# Patient Record
Sex: Male | Born: 1985 | Race: White | Hispanic: No | Marital: Married | Smoking: Never smoker
Health system: Southern US, Community
[De-identification: ages and names within clinical notes are randomized; demographics above are authoritative.]

## PROBLEM LIST (undated history)

## (undated) DIAGNOSIS — G4730 Sleep apnea, unspecified: Secondary | ICD-10-CM

## (undated) HISTORY — PX: TONSILLECTOMY: SUR1361

---

## 2017-09-15 ENCOUNTER — Emergency Department (HOSPITAL_COMMUNITY)
Admission: EM | Admit: 2017-09-15 | Discharge: 2017-09-16 | Disposition: A | Discharge status: Home / Self Care | Payer: BLUE CROSS/BLUE SHIELD | Attending: Emergency Medicine | Admitting: Emergency Medicine

## 2017-09-15 ENCOUNTER — Emergency Department (HOSPITAL_COMMUNITY): Payer: BLUE CROSS/BLUE SHIELD

## 2017-09-15 ENCOUNTER — Other Ambulatory Visit: Payer: Self-pay

## 2017-09-15 ENCOUNTER — Encounter (HOSPITAL_COMMUNITY): Payer: Self-pay

## 2017-09-15 ENCOUNTER — Emergency Department (HOSPITAL_COMMUNITY)
Admission: EM | Admit: 2017-09-15 | Discharge: 2017-09-15 | Disposition: A | Discharge status: Home / Self Care | Payer: BLUE CROSS/BLUE SHIELD

## 2017-09-15 DIAGNOSIS — R51 Headache: Secondary | ICD-10-CM | POA: Insufficient documentation

## 2017-09-15 DIAGNOSIS — B349 Viral infection, unspecified: Secondary | ICD-10-CM

## 2017-09-15 DIAGNOSIS — R509 Fever, unspecified: Secondary | ICD-10-CM | POA: Diagnosis not present

## 2017-09-15 DIAGNOSIS — R519 Headache, unspecified: Secondary | ICD-10-CM

## 2017-09-15 HISTORY — DX: Sleep apnea, unspecified: G47.30

## 2017-09-15 LAB — BASIC METABOLIC PANEL
Anion gap: 11 (ref 5–15)
BUN: 5 mg/dL — ABNORMAL LOW (ref 6–20)
CO2: 24 mmol/L (ref 22–32)
Calcium: 9.2 mg/dL (ref 8.9–10.3)
Chloride: 100 mmol/L — ABNORMAL LOW (ref 101–111)
Creatinine, Ser: 1.16 mg/dL (ref 0.61–1.24)
GFR calc Af Amer: >60 mL/min (ref >60)
GFR calc non Af Amer: >60 mL/min (ref >60)
Glucose, Bld: 114 mg/dL — ABNORMAL HIGH (ref 65–99)
Potassium: 3.5 mmol/L (ref 3.5–5.1)
Sodium: 135 mmol/L (ref 135–145)

## 2017-09-15 LAB — CBC WITH DIFFERENTIAL/PLATELET
Basophils Absolute: 0 10*3/uL (ref 0.0–0.1)
Basophils Relative: 0 %
Eosinophils Absolute: 0 10*3/uL (ref 0.0–0.7)
Eosinophils Relative: 0 %
HCT: 44.8 % (ref 39.0–52.0)
Hemoglobin: 16.1 g/dL (ref 13.0–17.0)
Lymphocytes Relative: 7 %
Lymphs Abs: 1.1 10*3/uL (ref 0.7–4.0)
MCH: 33.7 pg (ref 26.0–34.0)
MCHC: 35.9 g/dL (ref 30.0–36.0)
MCV: 93.7 fL (ref 78.0–100.0)
Monocytes Absolute: 1.4 10*3/uL — ABNORMAL HIGH (ref 0.1–1.0)
Monocytes Relative: 9 %
Neutro Abs: 12.8 10*3/uL — ABNORMAL HIGH (ref 1.7–7.7)
Neutrophils Relative %: 84 %
Platelets: 174 10*3/uL (ref 150–400)
RBC: 4.78 MIL/uL (ref 4.22–5.81)
RDW: 12.8 % (ref 11.5–15.5)
WBC: 15.4 10*3/uL — ABNORMAL HIGH (ref 4.0–10.5)

## 2017-09-15 LAB — I-STAT CG4 LACTIC ACID, ED: Lactic Acid, Venous: 1.25 mmol/L (ref 0.5–1.9)

## 2017-09-15 MED ORDER — LIDOCAINE HCL 2 % IJ SOLN
10.0000 mL | Freq: Once | INTRAMUSCULAR | Status: DC
  Filled 2017-09-15: qty 20

## 2017-09-15 MED ORDER — MORPHINE SULFATE (PF) 4 MG/ML IV SOLN
4.0000 mg | Freq: Once | INTRAVENOUS | Status: AC
  Administered 2017-09-15: 4 mg via INTRAVENOUS
  Filled 2017-09-15: qty 1

## 2017-09-15 NOTE — ED Notes (Signed)
EDP made aware of patients complaints of pain.

## 2017-09-15 NOTE — ED Notes (Signed)
EDP at bedside providing lumbar puncture.

## 2017-09-15 NOTE — ED Notes (Signed)
No answer in waiting room X1,  

## 2017-09-15 NOTE — ED Notes (Signed)
Hourly rounding   Informed of 5 hour wait  Thanked for patience  assured would be seen as soon as possible

## 2017-09-15 NOTE — ED Notes (Signed)
Pt back from CT. No distress observed. 

## 2017-09-15 NOTE — ED Notes (Signed)
ED Provider at bedside. Mina, PA 

## 2017-09-15 NOTE — ED Notes (Signed)
No answer in waiting room 

## 2017-09-15 NOTE — ED Notes (Signed)
Patient transported to CT 

## 2017-09-15 NOTE — ED Triage Notes (Signed)
Pt has had a headache for the past two days, fever today of 103.9, [facility] due to wait time. Some nausea with photosensitivity. Neuro intact
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
## 2017-09-16 ENCOUNTER — Emergency Department (HOSPITAL_COMMUNITY): Payer: BLUE CROSS/BLUE SHIELD

## 2017-09-16 LAB — CSF CELL COUNT WITH DIFFERENTIAL
RBC COUNT CSF: 172 /mm3 — AB
RBC Count, CSF: 0 /mm3
TUBE #: 1
TUBE #: 4
WBC CSF: 3 /mm3 (ref 0–5)
WBC, CSF: 2 /mm3 (ref 0–5)

## 2017-09-16 LAB — HEPATIC FUNCTION PANEL
ALT: 29 U/L (ref 17–63)
AST: 32 U/L (ref 15–41)
Albumin: 4.2 g/dL (ref 3.5–5.0)
Alkaline Phosphatase: 87 U/L (ref 38–126)
BILIRUBIN INDIRECT: 0.9 mg/dL (ref 0.3–0.9)
Bilirubin, Direct: 0.1 mg/dL (ref 0.1–0.5)
Total Bilirubin: 1 mg/dL (ref 0.3–1.2)
Total Protein: 7.5 g/dL (ref 6.5–8.1)

## 2017-09-16 LAB — PROTEIN, CSF: TOTAL PROTEIN, CSF: 45 mg/dL (ref 15–45)

## 2017-09-16 LAB — CRYPTOCOCCAL ANTIGEN, CSF: Crypto Ag: NEGATIVE

## 2017-09-16 LAB — GLUCOSE, CSF: GLUCOSE CSF: 69 mg/dL (ref 40–70)

## 2017-09-16 LAB — INFLUENZA PANEL BY PCR (TYPE A & B)
INFLAPCR: NEGATIVE
Influenza B By PCR: NEGATIVE

## 2017-09-16 MED ORDER — IBUPROFEN 600 MG PO TABS: 600.0000 mg | ORAL_TABLET | Freq: Four times a day (QID) | ORAL | 0 refills | Status: DC | PRN

## 2017-09-16 MED ORDER — SODIUM CHLORIDE 0.9 % IV BOLUS (SEPSIS)
1000.0000 mL | Freq: Once | INTRAVENOUS | Status: AC
  Administered 2017-09-16: 1000 mL via INTRAVENOUS

## 2017-09-16 MED ORDER — PROCHLORPERAZINE EDISYLATE 5 MG/ML IJ SOLN
10.0000 mg | Freq: Once | INTRAMUSCULAR | Status: AC
  Administered 2017-09-16: 10 mg via INTRAVENOUS
  Filled 2017-09-16: qty 2

## 2017-09-16 MED ORDER — KETOROLAC TROMETHAMINE 30 MG/ML IJ SOLN
30.0000 mg | Freq: Once | INTRAMUSCULAR | Status: AC
  Administered 2017-09-16: 30 mg via INTRAVENOUS
  Filled 2017-09-16: qty 1

## 2017-09-16 MED ORDER — ACETAMINOPHEN 500 MG PO TABS: 500.0000 mg | ORAL_TABLET | Freq: Four times a day (QID) | ORAL | 0 refills | Status: DC | PRN

## 2017-09-16 MED ORDER — DEXAMETHASONE SODIUM PHOSPHATE 10 MG/ML IJ SOLN
10.0000 mg | Freq: Once | INTRAMUSCULAR | Status: AC
  Administered 2017-09-16: 10 mg via INTRAVENOUS
  Filled 2017-09-16: qty 1

## 2017-09-16 NOTE — Discharge Instructions (Signed)
Your workup in the emergency department today was very reassuring.  We believe that your fever is due to a viral infection.  It is common for infections to promote headache.  This may be made worse by an elevated temperature/fever.  Continue to get plenty of rest and drink plenty of fluids to prevent dehydration.  We recommend the use of ibuprofen for fever, body aches, headache.  You may supplement this with Tylenol as prescribed, as needed, for persistent symptoms.  You may try Zyrtec or Claritin for congestion.  This may be supplemented with over-the-counter saline nasal spray or Flonase.  Follow-up with your primary care doctor to ensure resolution of symptoms.  YOu may return to the emergency department, as needed, if symptoms persist or worsen.

## 2017-09-16 NOTE — ED Provider Notes (Signed)
6:00 AM Patient care assumed at shift change from [facility], [place]-C.  Patient presenting to the emergency department for fever and headache.  He has also had nasal congestion and cough secondary to postnasal drip.  He was noted to be febrile and tachycardic on arrival with a leukocytosis.  No hypotension and lactate WNL.  No meningismus, though there was concern for meningitis.  He has a history of travel to [place] with return 9 days ago.  Patient with reassuring workup today.  His CSF studies do not suggest infection. Question whether tubes 1 and 4 were reversed in lab as it would be more likely for hematuria to clear after a traumatic tap.  Still, no organisms seen on culture.  Only 2-3 white blood cells per collection tube.  Cryptococcal antigen also negative.  No changes to CSF protein or glucose.  Patient reports improving headache with migraine cocktail.  He rating it at 3/10.  Wife expressed concern for influenza.  A flu swab was run as well as a chest x-ray, both of which are negative.  It is possible that fever is secondary to a viral illness.  Given [place]. Plan for continued supportive management with outpatient PCP follow up. Return precautions discussed and provided. Patient discharged in stable condition with no unaddressed concerns.   Results for orders placed or performed during the hospital encounter of 09/15/17  CSF culture  Result Value Ref Range   Specimen Description CSF    Special Requests NONE    Gram Stain      CYTOSPIN SMEAR WBC PRESENT, PREDOMINANTLY MONONUCLEAR NO ORGANISMS SEEN    Culture PENDING    Report Status PENDING   Basic metabolic panel  Result Value Ref Range   Sodium 135 135 - 145 mmol/L   Potassium 3.5 3.5 - 5.1 mmol/L   Chloride 100 (L) 101 - 111 mmol/L   CO2 24 22 - 32 mmol/L   Glucose, Bld 114 (H) 65 - 99 mg/dL   BUN 5 (L) 6 - 20 mg/dL   Creatinine, Ser 1.611.16 0.61 - 1.24 mg/dL   Calcium 9.2 8.9 -  09.610.3 mg/dL   GFR calc non Af Amer >60 >60 mL/min   GFR calc Af Amer >60 >60 mL/min   Anion gap 11 5 - 15  CBC with Differential  Result Value Ref Range   WBC 15.4 (H) 4.0 - 10.5 K/uL   RBC 4.78 4.22 - 5.81 MIL/uL   Hemoglobin 16.1 13.0 - 17.0 g/dL   HCT 04.544.8 40.939.0 - 81.152.0 %   MCV 93.7 78.0 - 100.0 fL   MCH 33.7 26.0 - 34.0 pg   MCHC 35.9 30.0 - 36.0 g/dL   RDW 91.412.8 78.211.5 - 95.615.5 %   Platelets 174 150 - 400 K/uL   Neutrophils Relative % 84 %   Neutro Abs 12.8 (H) 1.7 - 7.7 K/uL   Lymphocytes Relative 7 %   Lymphs Abs 1.1 0.7 - 4.0 K/uL   Monocytes Relative 9 %   Monocytes Absolute 1.4 (H) 0.1 - 1.0 K/uL   Eosinophils Relative 0 %   Eosinophils Absolute 0.0 0.0 - 0.7 K/uL   Basophils Relative 0 %   Basophils Absolute 0.0 0.0 - 0.1 K/uL  CSF cell count with differential collection tube #: 1  Result Value Ref Range   Tube # 1    Color, CSF COLORLESS COLORLESS   Appearance, CSF CLEAR CLEAR   Supernatant NOT INDICATED  RBC Count, CSF 0 0 /cu mm   WBC, CSF 2 0 - 5 /cu mm   Other Cells, CSF TOO FEW TO COUNT, SMEAR AVAILABLE FOR REVIEW   CSF cell count with differential collection tube #: 4  Result Value Ref Range   Tube # 4    Color, CSF COLORLESS COLORLESS   Appearance, CSF CLEAR CLEAR   Supernatant NOT INDICATED    RBC Count, CSF 172 (H) 0 /cu mm   WBC, CSF 3 0 - 5 /cu mm   Other Cells, CSF TOO FEW TO COUNT, SMEAR AVAILABLE FOR REVIEW   Glucose, CSF  Result Value Ref Range   Glucose, CSF 69 40 - 70 mg/dL  Protein, CSF  Result Value Ref Range   Total  Protein, CSF 45 15 - 45 mg/dL  Cryptococcal antigen, CSF  Result Value Ref Range   Crypto Ag NEGATIVE NEGATIVE   Cryptococcal Ag Titer NOT INDICATED NOT INDICATED  Influenza panel by PCR (type A & B)  Result Value Ref Range   Influenza A By PCR NEGATIVE NEGATIVE   Influenza B By PCR NEGATIVE NEGATIVE  Hepatic function panel  Result Value Ref Range   Total Protein 7.5 6.5 - 8.1 g/dL   Albumin 4.2 3.5 - 5.0 g/dL   AST  32 15 - 41 U/L   ALT 29 17 - 63 U/L   Alkaline Phosphatase 87 38 - 126 U/L   Total Bilirubin 1.0 0.3 - 1.2 mg/dL   Bilirubin, Direct 0.1 0.1 - 0.5 mg/dL   Indirect Bilirubin 0.9 0.3 - 0.9 mg/dL  I-Stat CG4 Lactic Acid, ED  Result Value Ref Range   Lactic Acid, Venous 1.25 0.5 - 1.9 mmol/L   Dg Chest 2 View  Result Date: 09/16/2017 CLINICAL DATA:  31 year old male with fever. EXAM: CHEST  2 VIEW COMPARISON:  None. FINDINGS: The heart size and mediastinal contours are within normal limits. Both lungs are clear. The visualized skeletal structures are unremarkable. IMPRESSION: No active cardiopulmonary disease. Electronically Signed   By: Elgie CollardArash  Radparvar M.D.   On: 09/16/2017 03:23   Ct Head Wo Contrast  Result Date: 09/15/2017 CLINICAL DATA:  31 year old male with headache. Concern for meningitis or encephalitis. EXAM: CT HEAD WITHOUT CONTRAST TECHNIQUE: Contiguous axial images were obtained from the base of the skull through the vertex without intravenous contrast. COMPARISON:  None. FINDINGS: Brain: No evidence of acute infarction, hemorrhage, hydrocephalus, extra-axial collection or mass lesion/mass effect. Vascular: No hyperdense vessel or unexpected calcification. Skull: Normal. Negative for fracture or focal lesion. Sinuses/Orbits: Minimal mucoperiosteal thickening of paranasal sinuses. No air-fluid levels. The mastoid air cells are clear. Other: None IMPRESSION: Unremarkable unenhanced CT of the brain. Please note noncontrast CT has limited diagnostic value for evaluation of meningitis or encephalitis. If further imaging workup is clinically indicated, MRI or CT angiography may provide better evaluation. Electronically Signed   By: Elgie CollardArash  Radparvar M.D.   On: 09/15/2017 23:23      Antony MaduraHumes, Jastin Fore, PA-C 09/16/17 [id number]    Geoffery Lyonselo, [place], MD 09/16/17 [phone]

## 2017-09-17 LAB — HERPES SIMPLEX VIRUS(HSV) DNA BY PCR
HSV 1 DNA: NEGATIVE
HSV 2 DNA: NEGATIVE

## 2017-09-19 LAB — CSF CULTURE W GRAM STAIN: Culture: NO GROWTH

## 2017-09-19 LAB — CSF CULTURE

## 2017-09-20 LAB — CULTURE, BLOOD (ROUTINE X 2)
Culture: NO GROWTH
Culture: NO GROWTH
Special Requests: ADEQUATE
Special Requests: ADEQUATE

## 2017-11-19 LAB — ARBOVIRUS IGG, CSF: West Nile IgG CSF: 0.35 IV (ref <1.30)

## 2017-11-19 LAB — VDRL, CSF: VDRL Quant, CSF: NONREACTIVE

## 2017-11-19 LAB — HSV CULTURE AND TYPING

## 2018-04-05 ENCOUNTER — Emergency Department (HOSPITAL_COMMUNITY): Payer: No Typology Code available for payment source

## 2018-04-05 ENCOUNTER — Inpatient Hospital Stay (HOSPITAL_COMMUNITY)
Admission: EM | Admit: 2018-04-05 | Discharge: 2018-04-12 | DRG: 516 | Disposition: A | Discharge status: Home / Self Care | Payer: No Typology Code available for payment source | Attending: Student | Admitting: Student

## 2018-04-05 ENCOUNTER — Encounter (HOSPITAL_COMMUNITY): Payer: Self-pay | Admitting: Oncology

## 2018-04-05 ENCOUNTER — Other Ambulatory Visit: Payer: Self-pay

## 2018-04-05 DIAGNOSIS — Z9089 Acquired absence of other organs: Secondary | ICD-10-CM

## 2018-04-05 DIAGNOSIS — Z653 Problems related to other legal circumstances: Secondary | ICD-10-CM

## 2018-04-05 DIAGNOSIS — F10929 Alcohol use, unspecified with intoxication, unspecified: Secondary | ICD-10-CM | POA: Diagnosis present

## 2018-04-05 DIAGNOSIS — M25551 Pain in right hip: Secondary | ICD-10-CM | POA: Diagnosis not present

## 2018-04-05 DIAGNOSIS — D62 Acute posthemorrhagic anemia: Secondary | ICD-10-CM | POA: Diagnosis not present

## 2018-04-05 DIAGNOSIS — S32421A Displaced fracture of posterior wall of right acetabulum, initial encounter for closed fracture: Secondary | ICD-10-CM | POA: Diagnosis not present

## 2018-04-05 DIAGNOSIS — Z23 Encounter for immunization: Secondary | ICD-10-CM

## 2018-04-05 DIAGNOSIS — F17220 Nicotine dependence, chewing tobacco, uncomplicated: Secondary | ICD-10-CM | POA: Diagnosis present

## 2018-04-05 DIAGNOSIS — F10129 Alcohol abuse with intoxication, unspecified: Secondary | ICD-10-CM | POA: Diagnosis present

## 2018-04-05 DIAGNOSIS — S01111A Laceration without foreign body of right eyelid and periocular area, initial encounter: Secondary | ICD-10-CM | POA: Diagnosis present

## 2018-04-05 DIAGNOSIS — R402362 Coma scale, best motor response, obeys commands, at arrival to emergency department: Secondary | ICD-10-CM | POA: Diagnosis present

## 2018-04-05 DIAGNOSIS — Z419 Encounter for procedure for purposes other than remedying health state, unspecified: Secondary | ICD-10-CM

## 2018-04-05 DIAGNOSIS — R402242 Coma scale, best verbal response, confused conversation, at arrival to emergency department: Secondary | ICD-10-CM | POA: Diagnosis present

## 2018-04-05 DIAGNOSIS — S32409A Unspecified fracture of unspecified acetabulum, initial encounter for closed fracture: Secondary | ICD-10-CM

## 2018-04-05 DIAGNOSIS — S73004A Unspecified dislocation of right hip, initial encounter: Secondary | ICD-10-CM

## 2018-04-05 DIAGNOSIS — R402142 Coma scale, eyes open, spontaneous, at arrival to emergency department: Secondary | ICD-10-CM | POA: Diagnosis present

## 2018-04-05 DIAGNOSIS — Z79899 Other long term (current) drug therapy: Secondary | ICD-10-CM

## 2018-04-05 DIAGNOSIS — S73014A Posterior dislocation of right hip, initial encounter: Secondary | ICD-10-CM | POA: Diagnosis present

## 2018-04-05 DIAGNOSIS — S0181XA Laceration without foreign body of other part of head, initial encounter: Secondary | ICD-10-CM

## 2018-04-05 DIAGNOSIS — S0081XA Abrasion of other part of head, initial encounter: Secondary | ICD-10-CM

## 2018-04-05 DIAGNOSIS — Y92410 Unspecified street and highway as the place of occurrence of the external cause: Secondary | ICD-10-CM

## 2018-04-05 DIAGNOSIS — Z880 Allergy status to penicillin: Secondary | ICD-10-CM

## 2018-04-05 DIAGNOSIS — S32401A Unspecified fracture of right acetabulum, initial encounter for closed fracture: Secondary | ICD-10-CM | POA: Diagnosis present

## 2018-04-05 LAB — CBC
HEMATOCRIT: 42.3 % (ref 39.0–52.0)
Hemoglobin: 14.5 g/dL (ref 13.0–17.0)
MCH: 31.9 pg (ref 26.0–34.0)
MCHC: 34.3 g/dL (ref 30.0–36.0)
MCV: 93 fL (ref 78.0–100.0)
Platelets: 225 10*3/uL (ref 150–400)
RBC: 4.55 MIL/uL (ref 4.22–5.81)
RDW: 12.2 % (ref 11.5–15.5)
WBC: 10.8 10*3/uL — AB (ref 4.0–10.5)

## 2018-04-05 LAB — COMPREHENSIVE METABOLIC PANEL
ALT: 35 U/L (ref 17–63)
AST: 61 U/L — AB (ref 15–41)
Albumin: 4.3 g/dL (ref 3.5–5.0)
Alkaline Phosphatase: 92 U/L (ref 38–126)
Anion gap: 9 (ref 5–15)
BUN: 11 mg/dL (ref 6–20)
CO2: 26 mmol/L (ref 22–32)
Calcium: 8.6 mg/dL — ABNORMAL LOW (ref 8.9–10.3)
Chloride: 109 mmol/L (ref 101–111)
Creatinine, Ser: 1.11 mg/dL (ref 0.61–1.24)
GFR calc Af Amer: >60 mL/min (ref >60)
Glucose, Bld: 116 mg/dL — ABNORMAL HIGH (ref 65–99)
POTASSIUM: 3.3 mmol/L — AB (ref 3.5–5.1)
Sodium: 144 mmol/L (ref 135–145)
Total Bilirubin: 0.7 mg/dL (ref 0.3–1.2)
Total Protein: 6.7 g/dL (ref 6.5–8.1)

## 2018-04-05 LAB — I-STAT CHEM 8, ED
BUN: 13 mg/dL (ref 6–20)
CREATININE: 1.3 mg/dL — AB (ref 0.61–1.24)
Calcium, Ion: 1.1 mmol/L — ABNORMAL LOW (ref 1.15–1.40)
Chloride: 106 mmol/L (ref 101–111)
Glucose, Bld: 114 mg/dL — ABNORMAL HIGH (ref 65–99)
HEMATOCRIT: 42 % (ref 39.0–52.0)
HEMOGLOBIN: 14.3 g/dL (ref 13.0–17.0)
Potassium: 3.2 mmol/L — ABNORMAL LOW (ref 3.5–5.1)
SODIUM: 147 mmol/L — AB (ref 135–145)
TCO2: 25 mmol/L (ref 22–32)

## 2018-04-05 LAB — I-STAT CG4 LACTIC ACID, ED: Lactic Acid, Venous: 2.1 mmol/L (ref 0.5–1.9)

## 2018-04-05 LAB — SAMPLE TO BLOOD BANK

## 2018-04-05 LAB — ETHANOL: Alcohol, Ethyl (B): 222 mg/dL — ABNORMAL HIGH (ref <10)

## 2018-04-05 LAB — PROTIME-INR
INR: 0.99
PROTHROMBIN TIME: 13 s (ref 11.4–15.2)

## 2018-04-05 MED ORDER — IOHEXOL 300 MG/ML  SOLN
100.0000 mL | Freq: Once | INTRAMUSCULAR | Status: AC | PRN
  Administered 2018-04-05: 100 mL via INTRAVENOUS

## 2018-04-05 MED ORDER — FENTANYL CITRATE (PF) 100 MCG/2ML IJ SOLN
100.0000 ug | Freq: Once | INTRAMUSCULAR | Status: AC
  Administered 2018-04-05: 100 ug via INTRAVENOUS

## 2018-04-05 MED ORDER — FENTANYL CITRATE (PF) 100 MCG/2ML IJ SOLN
INTRAMUSCULAR | Status: AC
  Administered 2018-04-05: 100 ug via INTRAVENOUS
  Filled 2018-04-05: qty 2

## 2018-04-05 NOTE — ED Notes (Signed)
Patient transported to CT 

## 2018-04-05 NOTE — ED Provider Notes (Signed)
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 orthopedic service.  CRITICAL CARE Performed by: Gilda CreasePOLLINA, Roman Dubuc J.   Total critical care time: 30 minutes  Critical care time was exclusive of separately billable procedures and treating other patients.  Critical care was necessary to treat or prevent imminent or life-threatening deterioration.  Critical care was time spent personally by me on the following activities: development of treatment plan with patient and/or surrogate as well as nursing, discussions with consultants, evaluation of patient's response to treatment, examination of patient, obtaining history from patient or surrogate, ordering and performing treatments and interventions, ordering and review of laboratory studies, ordering and review of radiographic studies, pulse oximetry and re-evaluation of patient's condition.   Final Clinical Impressions(s) / ED Diagnoses   Final diagnoses:  Facial laceration, initial encounter  Abrasion of face, initial  encounter  Closed displaced fracture of right acetabulum, unspecified portion of acetabulum, initial encounter [facility](HCC)  Hip dislocation, right, initial encounter [facility](HCC)    ED Discharge Orders    None       Donja Tipping, Canary Brimhristopher J, MD 04/06/18 0725
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
## 2018-04-06 ENCOUNTER — Inpatient Hospital Stay (HOSPITAL_COMMUNITY): Payer: No Typology Code available for payment source

## 2018-04-06 ENCOUNTER — Emergency Department (HOSPITAL_COMMUNITY): Payer: No Typology Code available for payment source

## 2018-04-06 DIAGNOSIS — R402242 Coma scale, best verbal response, confused conversation, at arrival to emergency department: Secondary | ICD-10-CM | POA: Diagnosis present

## 2018-04-06 DIAGNOSIS — S32401A Unspecified fracture of right acetabulum, initial encounter for closed fracture: Secondary | ICD-10-CM | POA: Diagnosis present

## 2018-04-06 DIAGNOSIS — R402362 Coma scale, best motor response, obeys commands, at arrival to emergency department: Secondary | ICD-10-CM | POA: Diagnosis present

## 2018-04-06 DIAGNOSIS — D62 Acute posthemorrhagic anemia: Secondary | ICD-10-CM | POA: Diagnosis not present

## 2018-04-06 DIAGNOSIS — Z79899 Other long term (current) drug therapy: Secondary | ICD-10-CM | POA: Diagnosis not present

## 2018-04-06 DIAGNOSIS — S32421A Displaced fracture of posterior wall of right acetabulum, initial encounter for closed fracture: Secondary | ICD-10-CM | POA: Diagnosis present

## 2018-04-06 DIAGNOSIS — M25551 Pain in right hip: Secondary | ICD-10-CM | POA: Diagnosis present

## 2018-04-06 DIAGNOSIS — Z653 Problems related to other legal circumstances: Secondary | ICD-10-CM | POA: Diagnosis not present

## 2018-04-06 DIAGNOSIS — F17220 Nicotine dependence, chewing tobacco, uncomplicated: Secondary | ICD-10-CM | POA: Diagnosis present

## 2018-04-06 DIAGNOSIS — R402142 Coma scale, eyes open, spontaneous, at arrival to emergency department: Secondary | ICD-10-CM | POA: Diagnosis present

## 2018-04-06 DIAGNOSIS — S73014A Posterior dislocation of right hip, initial encounter: Secondary | ICD-10-CM | POA: Diagnosis present

## 2018-04-06 DIAGNOSIS — Z880 Allergy status to penicillin: Secondary | ICD-10-CM | POA: Diagnosis not present

## 2018-04-06 DIAGNOSIS — F10129 Alcohol abuse with intoxication, unspecified: Secondary | ICD-10-CM | POA: Diagnosis present

## 2018-04-06 DIAGNOSIS — Z9089 Acquired absence of other organs: Secondary | ICD-10-CM | POA: Diagnosis not present

## 2018-04-06 DIAGNOSIS — Y92410 Unspecified street and highway as the place of occurrence of the external cause: Secondary | ICD-10-CM | POA: Diagnosis not present

## 2018-04-06 DIAGNOSIS — S01111A Laceration without foreign body of right eyelid and periocular area, initial encounter: Secondary | ICD-10-CM | POA: Diagnosis present

## 2018-04-06 DIAGNOSIS — Z23 Encounter for immunization: Secondary | ICD-10-CM | POA: Diagnosis not present

## 2018-04-06 LAB — URINALYSIS, ROUTINE W REFLEX MICROSCOPIC
BILIRUBIN URINE: NEGATIVE
Bacteria, UA: NONE SEEN
Glucose, UA: NEGATIVE mg/dL
Ketones, ur: NEGATIVE mg/dL
Leukocytes, UA: NEGATIVE
Nitrite: NEGATIVE
PROTEIN: NEGATIVE mg/dL
SPECIFIC GRAVITY, URINE: 1.027 (ref 1.005–1.030)
pH: 6 (ref 5.0–8.0)

## 2018-04-06 LAB — CBC WITH DIFFERENTIAL/PLATELET
Abs Immature Granulocytes: 0.1 10*3/uL (ref 0.0–0.1)
Basophils Absolute: 0 10*3/uL (ref 0.0–0.1)
Basophils Relative: 0 %
EOS ABS: 0.1 10*3/uL (ref 0.0–0.7)
EOS PCT: 0 %
HEMATOCRIT: 41.4 % (ref 39.0–52.0)
Hemoglobin: 14.1 g/dL (ref 13.0–17.0)
Immature Granulocytes: 0 %
Lymphocytes Relative: 13 %
Lymphs Abs: 1.6 10*3/uL (ref 0.7–4.0)
MCH: 31.8 pg (ref 26.0–34.0)
MCHC: 34.1 g/dL (ref 30.0–36.0)
MCV: 93.2 fL (ref 78.0–100.0)
MONO ABS: 1.3 10*3/uL — AB (ref 0.1–1.0)
MONOS PCT: 11 %
NEUTROS PCT: 76 %
Neutro Abs: 9.2 10*3/uL — ABNORMAL HIGH (ref 1.7–7.7)
PLATELETS: 183 10*3/uL (ref 150–400)
RBC: 4.44 MIL/uL (ref 4.22–5.81)
RDW: 12.3 % (ref 11.5–15.5)
WBC: 12.3 10*3/uL — ABNORMAL HIGH (ref 4.0–10.5)

## 2018-04-06 LAB — CDS SEROLOGY

## 2018-04-06 LAB — LACTIC ACID, PLASMA: Lactic Acid, Venous: 1.1 mmol/L (ref 0.5–1.9)

## 2018-04-06 MED ORDER — SODIUM CHLORIDE 0.9% FLUSH: 3.0000 mL | INTRAVENOUS | Status: DC | PRN

## 2018-04-06 MED ORDER — HYDROMORPHONE HCL 2 MG/ML IJ SOLN
0.5000 mg | Freq: Once | INTRAMUSCULAR | Status: AC
  Administered 2018-04-06: 0.5 mg via INTRAVENOUS
  Filled 2018-04-06: qty 1

## 2018-04-06 MED ORDER — LORAZEPAM 1 MG PO TABS
1.0000 mg | ORAL_TABLET | Freq: Four times a day (QID) | ORAL | Status: AC | PRN
  Administered 2018-04-08: 1 mg via ORAL
  Filled 2018-04-06: qty 1

## 2018-04-06 MED ORDER — DOCUSATE SODIUM 100 MG PO CAPS
100.0000 mg | ORAL_CAPSULE | Freq: Two times a day (BID) | ORAL | Status: DC
  Administered 2018-04-06 – 2018-04-12 (×12): 100 mg via ORAL
  Filled 2018-04-06 (×12): qty 1

## 2018-04-06 MED ORDER — PROPOFOL 10 MG/ML IV BOLUS
0.5000 mg/kg | Freq: Once | INTRAVENOUS | Status: AC
Start: 2018-04-06 — End: 2018-04-06
  Administered 2018-04-06: 43.6 mg via INTRAVENOUS
  Filled 2018-04-06: qty 20

## 2018-04-06 MED ORDER — SODIUM CHLORIDE 0.9 % IV SOLN: 250.0000 mL | INTRAVENOUS | Status: DC | PRN

## 2018-04-06 MED ORDER — FOLIC ACID 1 MG PO TABS
1.0000 mg | ORAL_TABLET | Freq: Every day | ORAL | Status: DC
  Administered 2018-04-07 – 2018-04-12 (×6): 1 mg via ORAL
  Filled 2018-04-06 (×6): qty 1

## 2018-04-06 MED ORDER — SENNOSIDES-DOCUSATE SODIUM 8.6-50 MG PO TABS
1.0000 | ORAL_TABLET | Freq: Every evening | ORAL | Status: DC | PRN
  Administered 2018-04-10: 1 via ORAL
  Filled 2018-04-06: qty 1

## 2018-04-06 MED ORDER — SODIUM CHLORIDE 0.9% FLUSH
3.0000 mL | Freq: Two times a day (BID) | INTRAVENOUS | Status: DC
Start: 2018-04-06 — End: 2018-04-12
  Administered 2018-04-06 – 2018-04-12 (×10): 3 mL via INTRAVENOUS

## 2018-04-06 MED ORDER — HYDROMORPHONE HCL 2 MG/ML IJ SOLN
1.0000 mg | INTRAMUSCULAR | Status: DC | PRN
  Administered 2018-04-06 – 2018-04-07 (×7): 1 mg via INTRAVENOUS
  Filled 2018-04-06 (×8): qty 1

## 2018-04-06 MED ORDER — OXYCODONE HCL 5 MG PO TABS
5.0000 mg | ORAL_TABLET | ORAL | Status: DC | PRN
  Administered 2018-04-06 – 2018-04-07 (×3): 5 mg via ORAL
  Filled 2018-04-06 (×5): qty 1

## 2018-04-06 MED ORDER — VITAMIN B-1 100 MG PO TABS
100.0000 mg | ORAL_TABLET | Freq: Every day | ORAL | Status: DC
  Administered 2018-04-07 – 2018-04-12 (×6): 100 mg via ORAL
  Filled 2018-04-06 (×6): qty 1

## 2018-04-06 MED ORDER — ASPIRIN EC 325 MG PO TBEC: 325.0000 mg | DELAYED_RELEASE_TABLET | Freq: Every day | ORAL | Status: DC

## 2018-04-06 MED ORDER — MAGNESIUM CITRATE PO SOLN
1.0000 | Freq: Once | ORAL | Status: AC | PRN
  Administered 2018-04-11: 1 via ORAL
  Filled 2018-04-06: qty 296

## 2018-04-06 MED ORDER — PROPOFOL 10 MG/ML IV BOLUS
INTRAVENOUS | Status: AC | PRN
  Administered 2018-04-06: 20 mg via INTRAVENOUS
  Administered 2018-04-06 (×3): 40 mg via INTRAVENOUS

## 2018-04-06 MED ORDER — LORAZEPAM 2 MG/ML IJ SOLN: 0.0000 mg | Freq: Four times a day (QID) | INTRAMUSCULAR | Status: AC

## 2018-04-06 MED ORDER — LORAZEPAM 2 MG/ML IJ SOLN: 1.0000 mg | Freq: Four times a day (QID) | INTRAMUSCULAR | Status: AC | PRN

## 2018-04-06 MED ORDER — LORAZEPAM 2 MG/ML IJ SOLN
0.0000 mg | Freq: Two times a day (BID) | INTRAMUSCULAR | Status: AC
  Administered 2018-04-10: 4 mg via INTRAVENOUS
  Filled 2018-04-06: qty 2

## 2018-04-06 MED ORDER — BISACODYL 10 MG RE SUPP
10.0000 mg | Freq: Every day | RECTAL | Status: DC | PRN
  Administered 2018-04-11: 10 mg via RECTAL
  Filled 2018-04-06: qty 1

## 2018-04-06 MED ORDER — ADULT MULTIVITAMIN W/MINERALS CH
1.0000 | ORAL_TABLET | Freq: Every day | ORAL | Status: DC
  Administered 2018-04-07 – 2018-04-12 (×6): 1 via ORAL
  Filled 2018-04-06 (×6): qty 1

## 2018-04-06 MED ORDER — POTASSIUM CHLORIDE IN NACL 20-0.9 MEQ/L-% IV SOLN
INTRAVENOUS | Status: DC
  Administered 2018-04-07 – 2018-04-08 (×3): via INTRAVENOUS
  Filled 2018-04-06 (×3): qty 1000

## 2018-04-06 MED ORDER — THIAMINE HCL 100 MG/ML IJ SOLN: 100.0000 mg | Freq: Every day | INTRAMUSCULAR | Status: DC
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
## 2018-04-06 NOTE — ED Notes (Signed)
Attempted report x1. 

## 2018-04-06 NOTE — Progress Notes (Signed)
Orthopedic Tech Progress Note Patient Details:  Spencer Gilbert 02/18/1986 [id number]  Ortho Devices Type of Ortho Device: Knee Immobilizer Ortho Device/Splint Location: rle. applyed post reduction. Ortho Device/Splint Interventions: Ordered, Application, Adjustment   Post Interventions Patient Tolerated: Well Instructions Provided: Care of device, Adjustment of device   [name], [name] 04/06/2018, 1:17 AM

## 2018-04-07 ENCOUNTER — Inpatient Hospital Stay (HOSPITAL_COMMUNITY): Payer: No Typology Code available for payment source | Admitting: Certified Registered Nurse Anesthetist

## 2018-04-07 ENCOUNTER — Inpatient Hospital Stay (HOSPITAL_COMMUNITY): Payer: No Typology Code available for payment source

## 2018-04-07 ENCOUNTER — Encounter (HOSPITAL_COMMUNITY)
Admission: EM | Disposition: A | Discharge status: Home / Self Care | Payer: Self-pay | Source: Home / Self Care | Attending: Student

## 2018-04-07 HISTORY — PX: ORIF ACETABULAR FRACTURE: SHX5029

## 2018-04-07 LAB — CBC WITH DIFFERENTIAL/PLATELET
Abs Immature Granulocytes: 0 10*3/uL (ref 0.0–0.1)
BASOS ABS: 0.1 10*3/uL (ref 0.0–0.1)
BASOS PCT: 1 %
Eosinophils Absolute: 0.2 10*3/uL (ref 0.0–0.7)
Eosinophils Relative: 2 %
HCT: 43.7 % (ref 39.0–52.0)
Hemoglobin: 14.8 g/dL (ref 13.0–17.0)
IMMATURE GRANULOCYTES: 0 %
Lymphocytes Relative: 16 %
Lymphs Abs: 1.8 10*3/uL (ref 0.7–4.0)
MCH: 31.6 pg (ref 26.0–34.0)
MCHC: 33.9 g/dL (ref 30.0–36.0)
MCV: 93.2 fL (ref 78.0–100.0)
MONO ABS: 1.5 10*3/uL — AB (ref 0.1–1.0)
Monocytes Relative: 13 %
NEUTROS ABS: 8 10*3/uL — AB (ref 1.7–7.7)
NEUTROS PCT: 68 %
PLATELETS: 179 10*3/uL (ref 150–400)
RBC: 4.69 MIL/uL (ref 4.22–5.81)
RDW: 12.1 % (ref 11.5–15.5)
WBC: 11.7 10*3/uL — AB (ref 4.0–10.5)

## 2018-04-07 LAB — ABO/RH: ABO/RH(D): O POS

## 2018-04-07 LAB — COMPREHENSIVE METABOLIC PANEL
ALBUMIN: 3.7 g/dL (ref 3.5–5.0)
ALT: 43 U/L (ref 17–63)
AST: 111 U/L — ABNORMAL HIGH (ref 15–41)
Alkaline Phosphatase: 86 U/L (ref 38–126)
Anion gap: 5 (ref 5–15)
BUN: 11 mg/dL (ref 6–20)
CHLORIDE: 104 mmol/L (ref 101–111)
CO2: 27 mmol/L (ref 22–32)
CREATININE: 0.83 mg/dL (ref 0.61–1.24)
Calcium: 8.7 mg/dL — ABNORMAL LOW (ref 8.9–10.3)
GFR calc non Af Amer: >60 mL/min (ref >60)
Glucose, Bld: 108 mg/dL — ABNORMAL HIGH (ref 65–99)
Potassium: 3.7 mmol/L (ref 3.5–5.1)
SODIUM: 136 mmol/L (ref 135–145)
Total Bilirubin: 0.9 mg/dL (ref 0.3–1.2)
Total Protein: 6.3 g/dL — ABNORMAL LOW (ref 6.5–8.1)

## 2018-04-07 LAB — TYPE AND SCREEN
ABO/RH(D): O POS
ANTIBODY SCREEN: NEGATIVE

## 2018-04-07 LAB — RAPID URINE DRUG SCREEN, HOSP PERFORMED
Amphetamines: NOT DETECTED
BARBITURATES: NOT DETECTED
Benzodiazepines: NOT DETECTED
Cocaine: NOT DETECTED
Opiates: POSITIVE — AB
TETRAHYDROCANNABINOL: NOT DETECTED

## 2018-04-07 LAB — POCT I-STAT 4, (NA,K, GLUC, HGB,HCT)
Glucose, Bld: 104 mg/dL — ABNORMAL HIGH (ref 65–99)
HEMATOCRIT: 37 % — AB (ref 39.0–52.0)
HEMOGLOBIN: 12.6 g/dL — AB (ref 13.0–17.0)
Potassium: 4.1 mmol/L (ref 3.5–5.1)
Sodium: 139 mmol/L (ref 135–145)

## 2018-04-07 LAB — PROTIME-INR
INR: 1.06
Prothrombin Time: 13.7 seconds (ref 11.4–15.2)

## 2018-04-07 LAB — MRSA PCR SCREENING: MRSA by PCR: NEGATIVE

## 2018-04-07 LAB — LACTIC ACID, PLASMA: Lactic Acid, Venous: 0.8 mmol/L (ref 0.5–1.9)

## 2018-04-07 LAB — APTT: aPTT: 30 seconds (ref 24–36)

## 2018-04-07 LAB — HIV ANTIBODY (ROUTINE TESTING W REFLEX): HIV Screen 4th Generation wRfx: NONREACTIVE

## 2018-04-07 SURGERY — OPEN REDUCTION INTERNAL FIXATION (ORIF) ACETABULAR FRACTURE
Anesthesia: General | Site: Hip | Laterality: Right

## 2018-04-07 MED ORDER — SUGAMMADEX SODIUM 200 MG/2ML IV SOLN
INTRAVENOUS | Status: DC | PRN
  Administered 2018-04-07: 200 mg via INTRAVENOUS

## 2018-04-07 MED ORDER — ALBUMIN HUMAN 5 % IV SOLN
INTRAVENOUS | Status: DC | PRN
  Administered 2018-04-07: 17:00:00 via INTRAVENOUS

## 2018-04-07 MED ORDER — HYDROMORPHONE HCL 1 MG/ML IJ SOLN
INTRAMUSCULAR | Status: DC | PRN
  Administered 2018-04-07 (×2): 0.5 mg via INTRAVENOUS

## 2018-04-07 MED ORDER — DIPHENHYDRAMINE HCL 50 MG/ML IJ SOLN
12.5000 mg | Freq: Four times a day (QID) | INTRAMUSCULAR | Status: DC | PRN
  Administered 2018-04-07: 12.5 mg via INTRAVENOUS
  Filled 2018-04-07: qty 1

## 2018-04-07 MED ORDER — ROCURONIUM BROMIDE 10 MG/ML (PF) SYRINGE
PREFILLED_SYRINGE | INTRAVENOUS | Status: AC
  Filled 2018-04-07: qty 10

## 2018-04-07 MED ORDER — DEXAMETHASONE SODIUM PHOSPHATE 4 MG/ML IJ SOLN
INTRAMUSCULAR | Status: DC | PRN
  Administered 2018-04-07: 12 mg via INTRAVENOUS

## 2018-04-07 MED ORDER — LIDOCAINE 2% (20 MG/ML) 5 ML SYRINGE
INTRAMUSCULAR | Status: DC | PRN
  Administered 2018-04-07: 60 mg via INTRAVENOUS

## 2018-04-07 MED ORDER — BACITRACIN ZINC 500 UNIT/GM EX OINT
TOPICAL_OINTMENT | CUTANEOUS | Status: DC | PRN
  Administered 2018-04-07: 1 via TOPICAL

## 2018-04-07 MED ORDER — OXYCODONE-ACETAMINOPHEN 5-325 MG PO TABS
ORAL_TABLET | ORAL | Status: AC
  Filled 2018-04-07: qty 2

## 2018-04-07 MED ORDER — TOBRAMYCIN SULFATE 1.2 G IJ SOLR
INTRAMUSCULAR | Status: AC
  Filled 2018-04-07: qty 1.2

## 2018-04-07 MED ORDER — CLINDAMYCIN PHOSPHATE 900 MG/50ML IV SOLN
INTRAVENOUS | Status: AC
  Filled 2018-04-07: qty 50

## 2018-04-07 MED ORDER — TOBRAMYCIN SULFATE 1.2 G IJ SOLR
INTRAMUSCULAR | Status: DC | PRN
  Administered 2018-04-07: 1.2 g

## 2018-04-07 MED ORDER — KETOROLAC TROMETHAMINE 30 MG/ML IJ SOLN
INTRAMUSCULAR | Status: AC
  Filled 2018-04-07: qty 1

## 2018-04-07 MED ORDER — SODIUM CHLORIDE 0.9 % IR SOLN
Status: DC | PRN
  Administered 2018-04-07: 3000 mL

## 2018-04-07 MED ORDER — PROPOFOL 10 MG/ML IV BOLUS
INTRAVENOUS | Status: AC
  Filled 2018-04-07: qty 20

## 2018-04-07 MED ORDER — ONDANSETRON HCL 4 MG/2ML IJ SOLN
INTRAMUSCULAR | Status: AC
  Filled 2018-04-07: qty 2

## 2018-04-07 MED ORDER — 0.9 % SODIUM CHLORIDE (POUR BTL) OPTIME
TOPICAL | Status: DC | PRN
  Administered 2018-04-07: 1000 mL

## 2018-04-07 MED ORDER — HYDROMORPHONE HCL 2 MG/ML IJ SOLN
1.0000 mg | INTRAMUSCULAR | Status: DC | PRN
  Administered 2018-04-07 – 2018-04-09 (×13): 1 mg via INTRAVENOUS
  Filled 2018-04-07 (×13): qty 1

## 2018-04-07 MED ORDER — VANCOMYCIN HCL 1000 MG IV SOLR
INTRAVENOUS | Status: AC
  Filled 2018-04-07: qty 1000

## 2018-04-07 MED ORDER — SODIUM CHLORIDE 0.9 % IV SOLN
1000.0000 mg | INTRAVENOUS | Status: AC
  Administered 2018-04-07: 1000 mg via INTRAVENOUS
  Filled 2018-04-07: qty 10

## 2018-04-07 MED ORDER — CLINDAMYCIN PHOSPHATE 900 MG/50ML IV SOLN
900.0000 mg | Freq: Three times a day (TID) | INTRAVENOUS | Status: AC
  Administered 2018-04-07 – 2018-04-08 (×3): 900 mg via INTRAVENOUS
  Filled 2018-04-07 (×4): qty 50

## 2018-04-07 MED ORDER — OXYCODONE-ACETAMINOPHEN 5-325 MG PO TABS
2.0000 | ORAL_TABLET | Freq: Four times a day (QID) | ORAL | Status: DC | PRN
  Administered 2018-04-07 – 2018-04-08 (×2): 2 via ORAL
  Filled 2018-04-07 (×2): qty 2

## 2018-04-07 MED ORDER — HYDROMORPHONE HCL 1 MG/ML IJ SOLN
INTRAMUSCULAR | Status: AC
  Filled 2018-04-07: qty 0.5

## 2018-04-07 MED ORDER — MIDAZOLAM HCL 2 MG/2ML IJ SOLN
INTRAMUSCULAR | Status: DC | PRN
  Administered 2018-04-07: 2 mg via INTRAVENOUS

## 2018-04-07 MED ORDER — PHENYLEPHRINE 40 MCG/ML (10ML) SYRINGE FOR IV PUSH (FOR BLOOD PRESSURE SUPPORT)
PREFILLED_SYRINGE | INTRAVENOUS | Status: AC
  Filled 2018-04-07: qty 10

## 2018-04-07 MED ORDER — DEXAMETHASONE SODIUM PHOSPHATE 10 MG/ML IJ SOLN
INTRAMUSCULAR | Status: AC
  Filled 2018-04-07: qty 1

## 2018-04-07 MED ORDER — ACETAMINOPHEN 500 MG PO TABS
500.0000 mg | ORAL_TABLET | Freq: Two times a day (BID) | ORAL | Status: DC
  Administered 2018-04-07 – 2018-04-12 (×9): 500 mg via ORAL
  Filled 2018-04-07 (×11): qty 1

## 2018-04-07 MED ORDER — SUCCINYLCHOLINE CHLORIDE 200 MG/10ML IV SOSY
PREFILLED_SYRINGE | INTRAVENOUS | Status: AC
  Filled 2018-04-07: qty 10

## 2018-04-07 MED ORDER — ONDANSETRON HCL 4 MG/2ML IJ SOLN
INTRAMUSCULAR | Status: DC | PRN
  Administered 2018-04-07: 4 mg via INTRAVENOUS

## 2018-04-07 MED ORDER — GABAPENTIN 100 MG PO CAPS
100.0000 mg | ORAL_CAPSULE | Freq: Three times a day (TID) | ORAL | Status: DC
  Administered 2018-04-07 – 2018-04-12 (×14): 100 mg via ORAL
  Filled 2018-04-07 (×14): qty 1

## 2018-04-07 MED ORDER — VANCOMYCIN HCL 500 MG IV SOLR
INTRAVENOUS | Status: AC
  Filled 2018-04-07: qty 1000

## 2018-04-07 MED ORDER — OXYCODONE-ACETAMINOPHEN 5-325 MG PO TABS: 1.0000 | ORAL_TABLET | ORAL | Status: DC | PRN

## 2018-04-07 MED ORDER — LIDOCAINE 2% (20 MG/ML) 5 ML SYRINGE
INTRAMUSCULAR | Status: AC
  Filled 2018-04-07: qty 5

## 2018-04-07 MED ORDER — CLINDAMYCIN PHOSPHATE 900 MG/50ML IV SOLN
900.0000 mg | INTRAVENOUS | Status: AC
  Administered 2018-04-07: 900 mg via INTRAVENOUS

## 2018-04-07 MED ORDER — LACTATED RINGERS IV SOLN
INTRAVENOUS | Status: DC
  Administered 2018-04-07 – 2018-04-08 (×6): via INTRAVENOUS

## 2018-04-07 MED ORDER — KETOROLAC TROMETHAMINE 15 MG/ML IJ SOLN
15.0000 mg | Freq: Four times a day (QID) | INTRAMUSCULAR | Status: AC
  Administered 2018-04-07 – 2018-04-08 (×4): 15 mg via INTRAVENOUS
  Filled 2018-04-07 (×4): qty 1

## 2018-04-07 MED ORDER — FENTANYL CITRATE (PF) 100 MCG/2ML IJ SOLN
25.0000 ug | INTRAMUSCULAR | Status: DC | PRN
  Administered 2018-04-07 (×3): 50 ug via INTRAVENOUS

## 2018-04-07 MED ORDER — DEXAMETHASONE SODIUM PHOSPHATE 10 MG/ML IJ SOLN
INTRAMUSCULAR | Status: DC | PRN
  Administered 2018-04-07: 10 mg via INTRAVENOUS

## 2018-04-07 MED ORDER — OXYCODONE HCL 5 MG PO TABS: 5.0000 mg | ORAL_TABLET | Freq: Once | ORAL | Status: DC | PRN

## 2018-04-07 MED ORDER — MIDAZOLAM HCL 2 MG/2ML IJ SOLN
INTRAMUSCULAR | Status: AC
  Filled 2018-04-07: qty 2

## 2018-04-07 MED ORDER — CHLORHEXIDINE GLUCONATE 4 % EX LIQD
60.0000 mL | Freq: Once | CUTANEOUS | Status: AC
  Administered 2018-04-07: 4 via TOPICAL
  Filled 2018-04-07: qty 15

## 2018-04-07 MED ORDER — OXYCODONE HCL 5 MG/5ML PO SOLN: 5.0000 mg | Freq: Once | ORAL | Status: DC | PRN

## 2018-04-07 MED ORDER — FENTANYL CITRATE (PF) 100 MCG/2ML IJ SOLN
INTRAMUSCULAR | Status: AC
  Administered 2018-04-07: 50 ug via INTRAVENOUS
  Filled 2018-04-07: qty 2

## 2018-04-07 MED ORDER — ROCURONIUM BROMIDE 10 MG/ML (PF) SYRINGE
PREFILLED_SYRINGE | INTRAVENOUS | Status: DC | PRN
  Administered 2018-04-07: 60 mg via INTRAVENOUS
  Administered 2018-04-07 (×5): 20 mg via INTRAVENOUS

## 2018-04-07 MED ORDER — POVIDONE-IODINE 10 % EX SWAB: 2.0000 "application " | Freq: Once | CUTANEOUS | Status: DC

## 2018-04-07 MED ORDER — PROPOFOL 10 MG/ML IV BOLUS
INTRAVENOUS | Status: AC
Start: 2018-04-07 — End: ?
  Filled 2018-04-07: qty 20

## 2018-04-07 MED ORDER — FENTANYL CITRATE (PF) 250 MCG/5ML IJ SOLN
INTRAMUSCULAR | Status: AC
  Filled 2018-04-07: qty 5

## 2018-04-07 MED ORDER — PROPOFOL 10 MG/ML IV BOLUS
INTRAVENOUS | Status: DC | PRN
  Administered 2018-04-07: 200 mg via INTRAVENOUS

## 2018-04-07 MED ORDER — FENTANYL CITRATE (PF) 250 MCG/5ML IJ SOLN
INTRAMUSCULAR | Status: DC | PRN
  Administered 2018-04-07: 50 ug via INTRAVENOUS
  Administered 2018-04-07 (×2): 100 ug via INTRAVENOUS

## 2018-04-07 MED ORDER — VANCOMYCIN HCL 1000 MG IV SOLR
INTRAVENOUS | Status: DC | PRN
  Administered 2018-04-07: 1000 mg

## 2018-04-07 MED ORDER — BACITRACIN ZINC 500 UNIT/GM EX OINT
TOPICAL_OINTMENT | CUTANEOUS | Status: AC
  Filled 2018-04-07: qty 28.35

## 2018-04-07 SURGICAL SUPPLY — 76 items
BIT DRILL 2.5 X LONG (BIT) ×1
BIT DRILL STEP 3.5 (DRILL) IMPLANT
BIT DRILL X LONG 2.5 (BIT) ×1 IMPLANT
BLADE CLIPPER SURG (BLADE) ×3 IMPLANT
BRUSH SCRUB SURG 4.25 DISP (MISCELLANEOUS) ×3 IMPLANT
CATH FOLEY 2WAY SLVR  5CC 12FR (CATHETERS) ×2
CATH FOLEY 2WAY SLVR 5CC 12FR (CATHETERS) ×1 IMPLANT
CHLORAPREP W/TINT 26ML (MISCELLANEOUS) ×3 IMPLANT
CLOSURE WOUND 1/2 X4 (GAUZE/BANDAGES/DRESSINGS)
COVER SURGICAL LIGHT HANDLE (MISCELLANEOUS) ×3 IMPLANT
DRAPE C-ARM 42X72 X-RAY (DRAPES) ×3 IMPLANT
DRAPE C-ARMOR (DRAPES) ×3 IMPLANT
DRAPE INCISE IOBAN 66X45 STRL (DRAPES) ×6 IMPLANT
DRAPE INCISE IOBAN 85X60 (DRAPES) ×3 IMPLANT
DRAPE ORTHO SPLIT 77X108 STRL (DRAPES) ×4
DRAPE SURG ORHT 6 SPLT 77X108 (DRAPES) ×2 IMPLANT
DRAPE U-SHAPE 47X51 STRL (DRAPES) ×3 IMPLANT
DRILL BIT X LONG 2.5 (BIT) ×2
DRILL STEP 3.5 (DRILL)
DRSG ADAPTIC 3X8 NADH LF (GAUZE/BANDAGES/DRESSINGS) ×3 IMPLANT
DRSG MEPILEX BORDER 4X12 (GAUZE/BANDAGES/DRESSINGS) IMPLANT
DRSG MEPILEX BORDER 4X8 (GAUZE/BANDAGES/DRESSINGS) IMPLANT
DRSG PAD ABDOMINAL 8X10 ST (GAUZE/BANDAGES/DRESSINGS) ×6 IMPLANT
ELECT BLADE 6.5 EXT (BLADE) ×3 IMPLANT
ELECT REM PT RETURN 9FT ADLT (ELECTROSURGICAL) ×3
ELECTRODE REM PT RTRN 9FT ADLT (ELECTROSURGICAL) ×1 IMPLANT
GAUZE SPONGE 4X4 12PLY STRL (GAUZE/BANDAGES/DRESSINGS) ×3 IMPLANT
GLOVE BIO SURGEON STRL SZ7.5 (GLOVE) ×12 IMPLANT
GLOVE BIOGEL PI IND STRL 7.5 (GLOVE) ×1 IMPLANT
GLOVE BIOGEL PI INDICATOR 7.5 (GLOVE) ×2
GOWN STRL REUS W/ TWL LRG LVL3 (GOWN DISPOSABLE) ×2 IMPLANT
GOWN STRL REUS W/TWL LRG LVL3 (GOWN DISPOSABLE) ×4
HANDPIECE INTERPULSE COAX TIP (DISPOSABLE) ×2
KIT BASIN OR (CUSTOM PROCEDURE TRAY) ×3 IMPLANT
KIT TURNOVER KIT B (KITS) ×3 IMPLANT
MANIFOLD NEPTUNE II (INSTRUMENTS) ×3 IMPLANT
NS IRRIG 1000ML POUR BTL (IV SOLUTION) ×3 IMPLANT
PACK TOTAL JOINT (CUSTOM PROCEDURE TRAY) ×3 IMPLANT
PAD ARMBOARD 7.5X6 YLW CONV (MISCELLANEOUS) ×6 IMPLANT
PLATE BONE 91MM 7HOLE PELVIC (Plate) ×3 IMPLANT
PLATE BONE LOCK 65MM 5 HOLE (Plate) ×3 IMPLANT
RETRIEVER SUT HEWSON (MISCELLANEOUS) ×3 IMPLANT
SCREW CORTEX 3.5 28MM (Screw) ×2 IMPLANT
SCREW CORTEX 3.5 32MM (Screw) ×2 IMPLANT
SCREW CORTEX 3.5 36MM (Screw) ×2 IMPLANT
SCREW CORTEX 3.5X40MM (Screw) ×6 IMPLANT
SCREW CORTEX 3.5X45MM (Screw) ×3 IMPLANT
SCREW CORTEX 3.5X50MM (Screw) ×2 IMPLANT
SCREW LOCK CORT ST 3.5X28 (Screw) ×1 IMPLANT
SCREW LOCK CORT ST 3.5X32 (Screw) ×1 IMPLANT
SCREW LOCK CORT ST 3.5X36 (Screw) ×1 IMPLANT
SCREW PELVIC CORT ST 3.5X50 (Screw) ×1 IMPLANT
SET HNDPC FAN SPRY TIP SCT (DISPOSABLE) ×1 IMPLANT
SPONGE LAP 18X18 X RAY DECT (DISPOSABLE) ×9 IMPLANT
STAPLER VISISTAT 35W (STAPLE) ×3 IMPLANT
STRIP CLOSURE SKIN 1/2X4 (GAUZE/BANDAGES/DRESSINGS) IMPLANT
SUCTION FRAZIER HANDLE 10FR (MISCELLANEOUS) ×2
SUCTION TUBE FRAZIER 10FR DISP (MISCELLANEOUS) ×1 IMPLANT
SUT ETHILON 2 0 PSLX (SUTURE) ×6 IMPLANT
SUT FIBERWIRE #2 38 T-5 BLUE (SUTURE)
SUT MNCRL AB 3-0 PS2 18 (SUTURE) IMPLANT
SUT MON AB 2-0 CT1 36 (SUTURE) IMPLANT
SUT VIC AB 0 CT1 27 (SUTURE)
SUT VIC AB 0 CT1 27XBRD ANBCTR (SUTURE) IMPLANT
SUT VIC AB 1 CT1 18XCR BRD 8 (SUTURE) ×1 IMPLANT
SUT VIC AB 1 CT1 27 (SUTURE) ×4
SUT VIC AB 1 CT1 27XBRD ANBCTR (SUTURE) ×2 IMPLANT
SUT VIC AB 1 CT1 8-18 (SUTURE) ×2
SUT VIC AB 2-0 CT1 27 (SUTURE) ×6
SUT VIC AB 2-0 CT1 TAPERPNT 27 (SUTURE) ×3 IMPLANT
SUTURE FIBERWR #2 38 T-5 BLUE (SUTURE) IMPLANT
TAPE CLOTH SURG 4X10 WHT LF (GAUZE/BANDAGES/DRESSINGS) ×3 IMPLANT
TOWEL OR 17X24 6PK STRL BLUE (TOWEL DISPOSABLE) IMPLANT
TOWEL OR 17X26 10 PK STRL BLUE (TOWEL DISPOSABLE) ×3 IMPLANT
TRAY FOLEY MTR SLVR 16FR STAT (SET/KITS/TRAYS/PACK) ×3 IMPLANT
WATER STERILE IRR 1000ML POUR (IV SOLUTION) IMPLANT

## 2018-04-07 NOTE — Plan of Care (Signed)
  Problem: Activity: Goal: Risk for activity intolerance will decrease Outcome: Progressing   Problem: Safety: Goal: Ability to remain free from injury will improve Outcome: Progressing   
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
## 2018-04-07 NOTE — H&P (Signed)
Please see my consult note for full H&P

## 2018-04-07 NOTE — Progress Notes (Signed)
Patient ID: Spencer Gilbert, male   DOB: 01/16/1986, 32 y.o.   MRN: [id number] Patient resting comfortably this morning.  Family in the room.  Surgical and postoperative questions were answered.  Patient scheduled for open reduction internal fixation of the right acetabular fracture with Dr. Carola FrostHandy this afternoon.

## 2018-04-07 NOTE — Anesthesia Preprocedure Evaluation (Signed)
Anesthesia Evaluation  Patient identified by MRN, date of birth, ID band Patient awake    Reviewed: Allergy & Precautions, NPO status , Patient's Chart, lab work & pertinent test results  History of Anesthesia Complications Negative for: history of anesthetic complications  Airway Mallampati: II  TM Distance: >3 FB Neck ROM: Full    Dental  (+) Teeth Intact   Pulmonary neg pulmonary ROS,    breath sounds clear to auscultation       Cardiovascular negative cardio ROS   Rhythm:Regular     Neuro/Psych negative neurological ROS  negative psych ROS   GI/Hepatic negative GI ROS,   Endo/Other  negative endocrine ROS  Renal/GU negative Renal ROS     Musculoskeletal Right acetabular fx   Abdominal   Peds  Hematology negative hematology ROS (+)   Anesthesia Other Findings   Reproductive/Obstetrics                             Anesthesia Physical Anesthesia Plan  ASA: I  Anesthesia Plan: General   Post-op Pain Management:    Induction: Intravenous  PONV Risk Score and Plan: 2 and Ondansetron and Dexamethasone  Airway Management Planned: Oral ETT  Additional Equipment: None  Intra-op Plan:   Post-operative Plan: Extubation in OR  Informed Consent: I have reviewed the patients History and Physical, chart, labs and discussed the procedure including the risks, benefits and alternatives for the proposed anesthesia with the patient or authorized representative who has indicated his/her understanding and acceptance.   Dental advisory given  Plan Discussed with: CRNA and Surgeon  Anesthesia Plan Comments:         Anesthesia Quick Evaluation

## 2018-04-07 NOTE — Transfer of Care (Signed)
Immediate Anesthesia Transfer of Care Note  Patient: Spencer Gilbert  Procedure(s) Performed: OPEN REDUCTION INTERNAL FIXATION ACETABULAR FRACTURE (Right Hip)  Patient Location: PACU  Anesthesia Type:General  Level of Consciousness: awake, alert  and oriented  Airway & Oxygen Therapy: Patient Spontanous Breathing  Post-op Assessment: Report given to RN and Post -op Vital signs reviewed and stable  Post vital signs: Reviewed and stable  Last Vitals:  Vitals Value Taken Time  BP 145/80 04/07/2018  7:34 PM  Temp    Pulse 83 04/07/2018  7:35 PM  Resp 13 04/07/2018  7:35 PM  SpO2 99 % 04/07/2018  7:35 PM  Vitals shown include unvalidated device data.  Last Pain:  Vitals:   04/07/18 1428  TempSrc: Oral  PainSc:       Patients Stated Pain Goal: 1 (04/06/18 2134)  Complications: No apparent anesthesia complications

## 2018-04-07 NOTE — Anesthesia Postprocedure Evaluation (Signed)
Anesthesia Post Note  Patient: Shanon PayorJoshua Lomeli  Procedure(s) Performed: OPEN REDUCTION INTERNAL FIXATION ACETABULAR FRACTURE (Right Hip)     Patient location during evaluation: PACU Anesthesia Type: General Level of consciousness: awake and alert Pain management: pain level controlled Vital Signs Assessment: post-procedure vital signs reviewed and stable Respiratory status: spontaneous breathing, nonlabored ventilation and respiratory function stable Cardiovascular status: blood pressure returned to baseline and stable Postop Assessment: no apparent nausea or vomiting Anesthetic complications: no    Last Vitals:  Vitals:   04/07/18 2019 04/07/18 2026  BP: 131/85   Pulse: 96   Resp: 20   Temp:  36.5 C  SpO2: 96%     Last Pain:  Vitals:   04/07/18 2026  TempSrc:   PainSc: 5                  Beryle Lathehomas E Ariann Khaimov
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
## 2018-04-07 NOTE — Anesthesia Procedure Notes (Signed)
Procedure Name: Intubation Performed by: Taurus Alamo B, CRNA Pre-anesthesia Checklist: Patient identified, Emergency Drugs available, Suction available and Patient being monitored Patient Re-evaluated:Patient Re-evaluated prior to induction Oxygen Delivery Method: Circle System Utilized Preoxygenation: Pre-oxygenation with 100% oxygen Induction Type: IV induction Ventilation: Mask ventilation without difficulty Laryngoscope Size: Mac and 4 Grade View: Grade I Tube type: Oral Tube size: 7.5 mm Number of attempts: 1 Airway Equipment and Method: Stylet and Oral airway Placement Confirmation: ETT inserted through vocal cords under direct vision,  positive ETCO2 and breath sounds checked- equal and bilateral Secured at: 22 cm Tube secured with: Tape Dental Injury: Teeth and Oropharynx as per pre-operative assessment        

## 2018-04-08 ENCOUNTER — Encounter (HOSPITAL_COMMUNITY): Payer: Self-pay | Admitting: Student

## 2018-04-08 ENCOUNTER — Other Ambulatory Visit: Payer: Self-pay

## 2018-04-08 LAB — CBC
HCT: 33 % — ABNORMAL LOW (ref 39.0–52.0)
Hemoglobin: 11.3 g/dL — ABNORMAL LOW (ref 13.0–17.0)
MCH: 32.1 pg (ref 26.0–34.0)
MCHC: 34.2 g/dL (ref 30.0–36.0)
MCV: 93.8 fL (ref 78.0–100.0)
PLATELETS: 227 10*3/uL (ref 150–400)
RBC: 3.52 MIL/uL — AB (ref 4.22–5.81)
RDW: 11.8 % (ref 11.5–15.5)
WBC: 16.7 10*3/uL — ABNORMAL HIGH (ref 4.0–10.5)

## 2018-04-08 MED ORDER — PANTOPRAZOLE SODIUM 40 MG PO TBEC
40.0000 mg | DELAYED_RELEASE_TABLET | Freq: Every day | ORAL | Status: DC
  Administered 2018-04-08 – 2018-04-12 (×5): 40 mg via ORAL
  Filled 2018-04-08 (×5): qty 1

## 2018-04-08 MED ORDER — ENOXAPARIN SODIUM 40 MG/0.4ML ~~LOC~~ SOLN
40.0000 mg | SUBCUTANEOUS | Status: DC
  Administered 2018-04-08 – 2018-04-12 (×5): 40 mg via SUBCUTANEOUS
  Filled 2018-04-08 (×5): qty 0.4

## 2018-04-08 MED ORDER — FLUTICASONE PROPIONATE 50 MCG/ACT NA SUSP
2.0000 | Freq: Every day | NASAL | Status: DC
  Administered 2018-04-08 – 2018-04-12 (×5): 2 via NASAL
  Filled 2018-04-08: qty 16

## 2018-04-08 MED ORDER — ESCITALOPRAM OXALATE 10 MG PO TABS
20.0000 mg | ORAL_TABLET | Freq: Every day | ORAL | Status: DC
  Administered 2018-04-08 – 2018-04-11 (×4): 20 mg via ORAL
  Filled 2018-04-08 (×4): qty 2
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
## 2018-04-08 NOTE — Progress Notes (Signed)
Patient insistent on removal of foley. Educated on the need to void within 6-8 hours or we would need to do an in and out cath.  He is agreeable.  Catheter removed.

## 2018-04-08 NOTE — Plan of Care (Signed)
  Problem: Activity: Goal: Risk for activity intolerance will decrease Outcome: Progressing   
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
## 2018-04-09 MED ORDER — OXYCODONE HCL 5 MG PO TABS
15.0000 mg | ORAL_TABLET | ORAL | Status: DC | PRN
  Administered 2018-04-09 – 2018-04-12 (×13): 15 mg via ORAL
  Filled 2018-04-09 (×13): qty 3
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
## 2018-04-10 DIAGNOSIS — F10929 Alcohol use, unspecified with intoxication, unspecified: Secondary | ICD-10-CM | POA: Diagnosis present

## 2018-04-10 MED ORDER — HYDROMORPHONE HCL 2 MG/ML IJ SOLN: 1.0000 mg | Freq: Three times a day (TID) | INTRAMUSCULAR | Status: DC | PRN

## 2018-04-10 MED ORDER — HYDROMORPHONE HCL 2 MG/ML IJ SOLN
1.0000 mg | Freq: Three times a day (TID) | INTRAMUSCULAR | Status: DC | PRN
  Administered 2018-04-10: 1 mg via INTRAVENOUS
  Filled 2018-04-10: qty 1
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
## 2018-04-11 MED ORDER — PNEUMOCOCCAL VAC POLYVALENT 25 MCG/0.5ML IJ INJ
0.5000 mL | INJECTION | INTRAMUSCULAR | Status: AC
  Administered 2018-04-12: 0.5 mL via INTRAMUSCULAR
  Filled 2018-04-11: qty 0.5

## 2018-04-11 MED ORDER — LORAZEPAM 1 MG PO TABS
1.0000 mg | ORAL_TABLET | Freq: Four times a day (QID) | ORAL | Status: DC | PRN
  Administered 2018-04-11 – 2018-04-12 (×2): 1 mg via ORAL
  Filled 2018-04-11 (×2): qty 1
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
## 2018-04-12 MED ORDER — FOLIC ACID 1 MG PO TABS: 1.0000 mg | ORAL_TABLET | Freq: Every day | ORAL | 1 refills | Status: AC

## 2018-04-12 MED ORDER — METHOCARBAMOL 750 MG PO TABS: 750.0000 mg | ORAL_TABLET | Freq: Four times a day (QID) | ORAL | 0 refills | Status: AC | PRN

## 2018-04-12 MED ORDER — ACETAMINOPHEN 500 MG PO TABS: 500.0000 mg | ORAL_TABLET | Freq: Two times a day (BID) | ORAL | 0 refills | Status: AC

## 2018-04-12 MED ORDER — OXYCODONE HCL 10 MG PO TABS: 10.0000 mg | ORAL_TABLET | ORAL | 0 refills | Status: AC | PRN

## 2018-04-12 MED ORDER — ENOXAPARIN SODIUM 40 MG/0.4ML ~~LOC~~ SOLN: 40.0000 mg | SUBCUTANEOUS | 0 refills | Status: AC

## 2018-04-12 MED ORDER — GABAPENTIN 100 MG PO CAPS: 100.0000 mg | ORAL_CAPSULE | Freq: Three times a day (TID) | ORAL | 0 refills | Status: AC

## 2018-04-12 NOTE — Discharge Instructions (Signed)
[facility] Discharge Instructions   General Discharge Instructions  WEIGHT BEARING STATUS: Touch down weight bearing to right leg  RANGE OF MOTION/ACTIVITY: No restrictions to the right lower extremity except weight bearing as noted above  Wound Care: May remove dressing and leave open to air, may shower and let water run over the incision. Do not scrub incision.  DVT/PE prophylaxis: Lovenox 40 mg daily for 30 days.  Diet: as you were eating previously.  Can use over the counter stool softeners and bowel preparations, such as Miralax, to help with bowel movements.  Narcotics can be constipating.  Be sure to drink plenty of fluids  PAIN MEDICATION USE AND EXPECTATIONS  You have likely been given narcotic medications to help control your pain.  After a traumatic event that results in an fracture (broken bone) with or without surgery, it is ok to use narcotic pain medications to help control one's pain.  We understand that everyone responds to pain differently and each individual patient will be evaluated on a regular basis for the continued need for narcotic medications. Ideally, narcotic medication use should last no more than 6-8 weeks (coinciding with fracture healing).   As a patient it is your responsibility as well to monitor narcotic medication use and report the amount and frequency you use these medications when you come to your office visit.   We would also advise that if you are using narcotic medications, you should take a dose prior to therapy to maximize you participation.  IF YOU ARE ON NARCOTIC MEDICATIONS IT IS NOT PERMISSIBLE TO OPERATE A MOTOR VEHICLE (MOTORCYCLE/CAR/TRUCK/MOPED) OR HEAVY MACHINERY DO NOT MIX NARCOTICS WITH OTHER CNS (CENTRAL NERVOUS SYSTEM) DEPRESSANTS SUCH AS ALCOHOL   STOP SMOKING OR USING NICOTINE PRODUCTS!!!!  As discussed nicotine severely impairs your body's ability to heal surgical and traumatic wounds but also impairs bone healing.   Wounds and bone heal by forming microscopic blood vessels (angiogenesis) and nicotine is a vasoconstrictor (essentially, shrinks blood vessels).  Therefore, if vasoconstriction occurs to these microscopic blood vessels they essentially disappear and are unable to deliver necessary nutrients to the healing tissue.  This is one modifiable factor that you can do to dramatically increase your chances of healing your injury.    (This means no smoking, no nicotine gum, patches, etc)  DO NOT USE NONSTEROIDAL ANTI-INFLAMMATORY DRUGS (NSAID'S)  Using products such as Advil (ibuprofen), Aleve (naproxen), Motrin (ibuprofen) for additional pain control during fracture healing can delay and/or prevent the healing response.  If you would like to take over the counter (OTC) medication, Tylenol (acetaminophen) is ok.  However, some narcotic medications that are given for pain control contain acetaminophen as well. Therefore, you should not exceed more than 4000 mg of tylenol in a day if you do not have liver disease.  Also note that there are may OTC medicines, such as cold medicines and allergy medicines that my contain tylenol as well.  If you have any questions about medications and/or interactions please ask your doctor/PA or your pharmacist.      ICE AND ELEVATE INJURED/OPERATIVE EXTREMITY  Using ice and elevating the injured extremity above your heart can help with swelling and pain control.  Icing in a pulsatile fashion, such as 20 minutes on and 20 minutes off, can be followed.    Do not place ice directly on skin. Make sure there is a barrier between to skin and the ice pack.    Using frozen items such as frozen peas  works well as the conform nicely to the are that needs to be iced.  USE AN ACE WRAP OR TED HOSE FOR SWELLING CONTROL  In addition to icing and elevation, Ace wraps or TED hose are used to help limit and resolve swelling.  It is recommended to use Ace wraps or TED hose until you are informed to  stop.    When using Ace Wraps start the wrapping distally (farthest away from the body) and wrap proximally (closer to the body)   Example: If you had surgery on your leg or thing and you do not have a splint on, start the ace wrap at the toes and work your way up to the thigh        If you had surgery on your upper extremity and do not have a splint on, start the ace wrap at your fingers and work your way up to the upper arm  CALL THE OFFICE WITH ANY QUESTIONS OR CONCERNS: [phone]  Discharge Wound Care Instructions  Do NOT apply any ointments, solutions or lotions to pin sites or surgical wounds.  These prevent needed drainage and even though solutions like hydrogen peroxide kill bacteria, they also damage cells lining the pin sites that help fight infection.  Applying lotions or ointments can keep the wounds moist and can cause them to breakdown and open up as well. This can increase the risk for infection. When in doubt call the office.

## 2018-04-12 NOTE — Progress Notes (Signed)
Provided discharge education/instructions, all questions and concerns addressed, discharged home with belongings accompanied by son and wife.
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
## 2018-04-12 NOTE — Plan of Care (Signed)
  Problem: Activity: Goal: Risk for activity intolerance will decrease Outcome: Progressing   Problem: Elimination: Goal: Will not experience complications related to bowel motility Outcome: Progressing   Problem: Pain Managment: Goal: General experience of comfort will improve Outcome: Progressing   Problem: Safety: Goal: Ability to remain free from injury will improve Outcome: Progressing   

## 2018-04-12 NOTE — Plan of Care (Signed)
Pain is manageable. Ativan added for anxiety.

## 2018-10-07 IMAGING — CR DG PELVIS 3+V JUDET
3 series · 3 of 3 positions shown · non-contrast
Comparison: CT 04/06/2018

CLINICAL DATA: Status post ORIF of right acetabulum.

EXAM:
JUDET PELVIS - 3+ VIEW

[AP (1 of 3)]
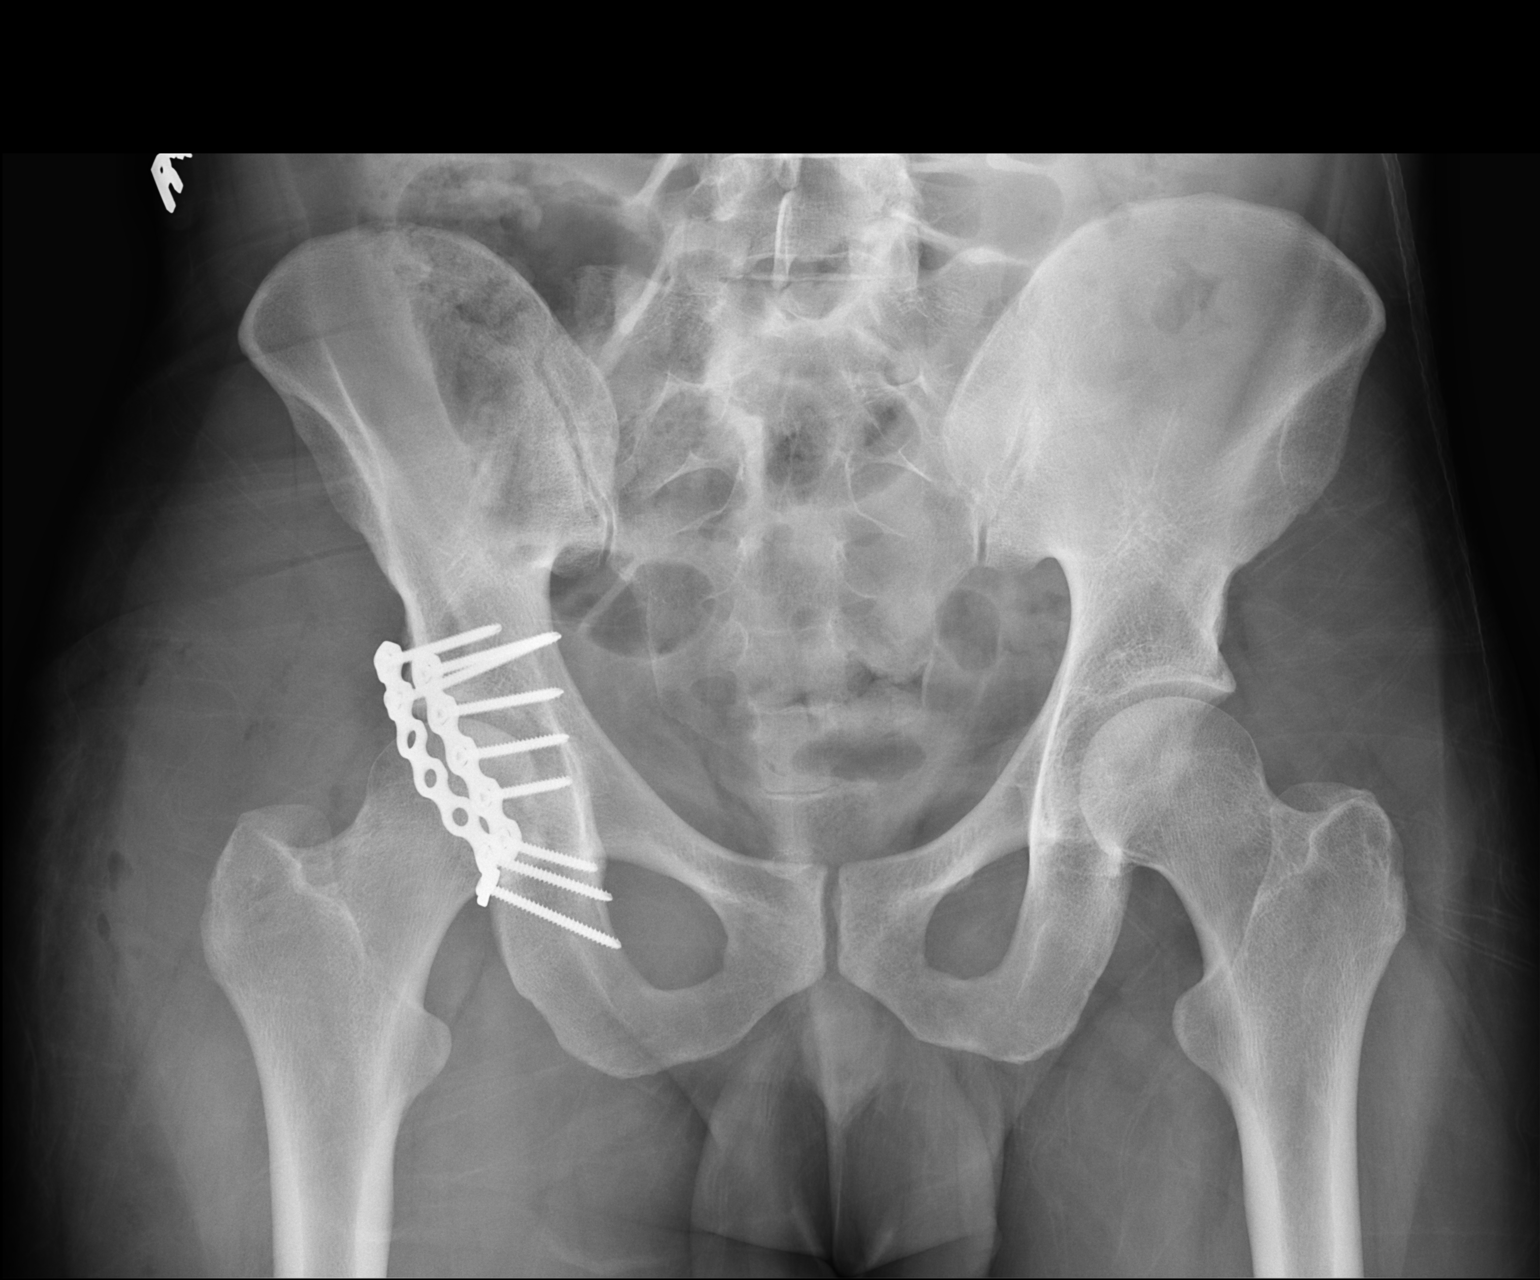

[AP (2 of 3)]
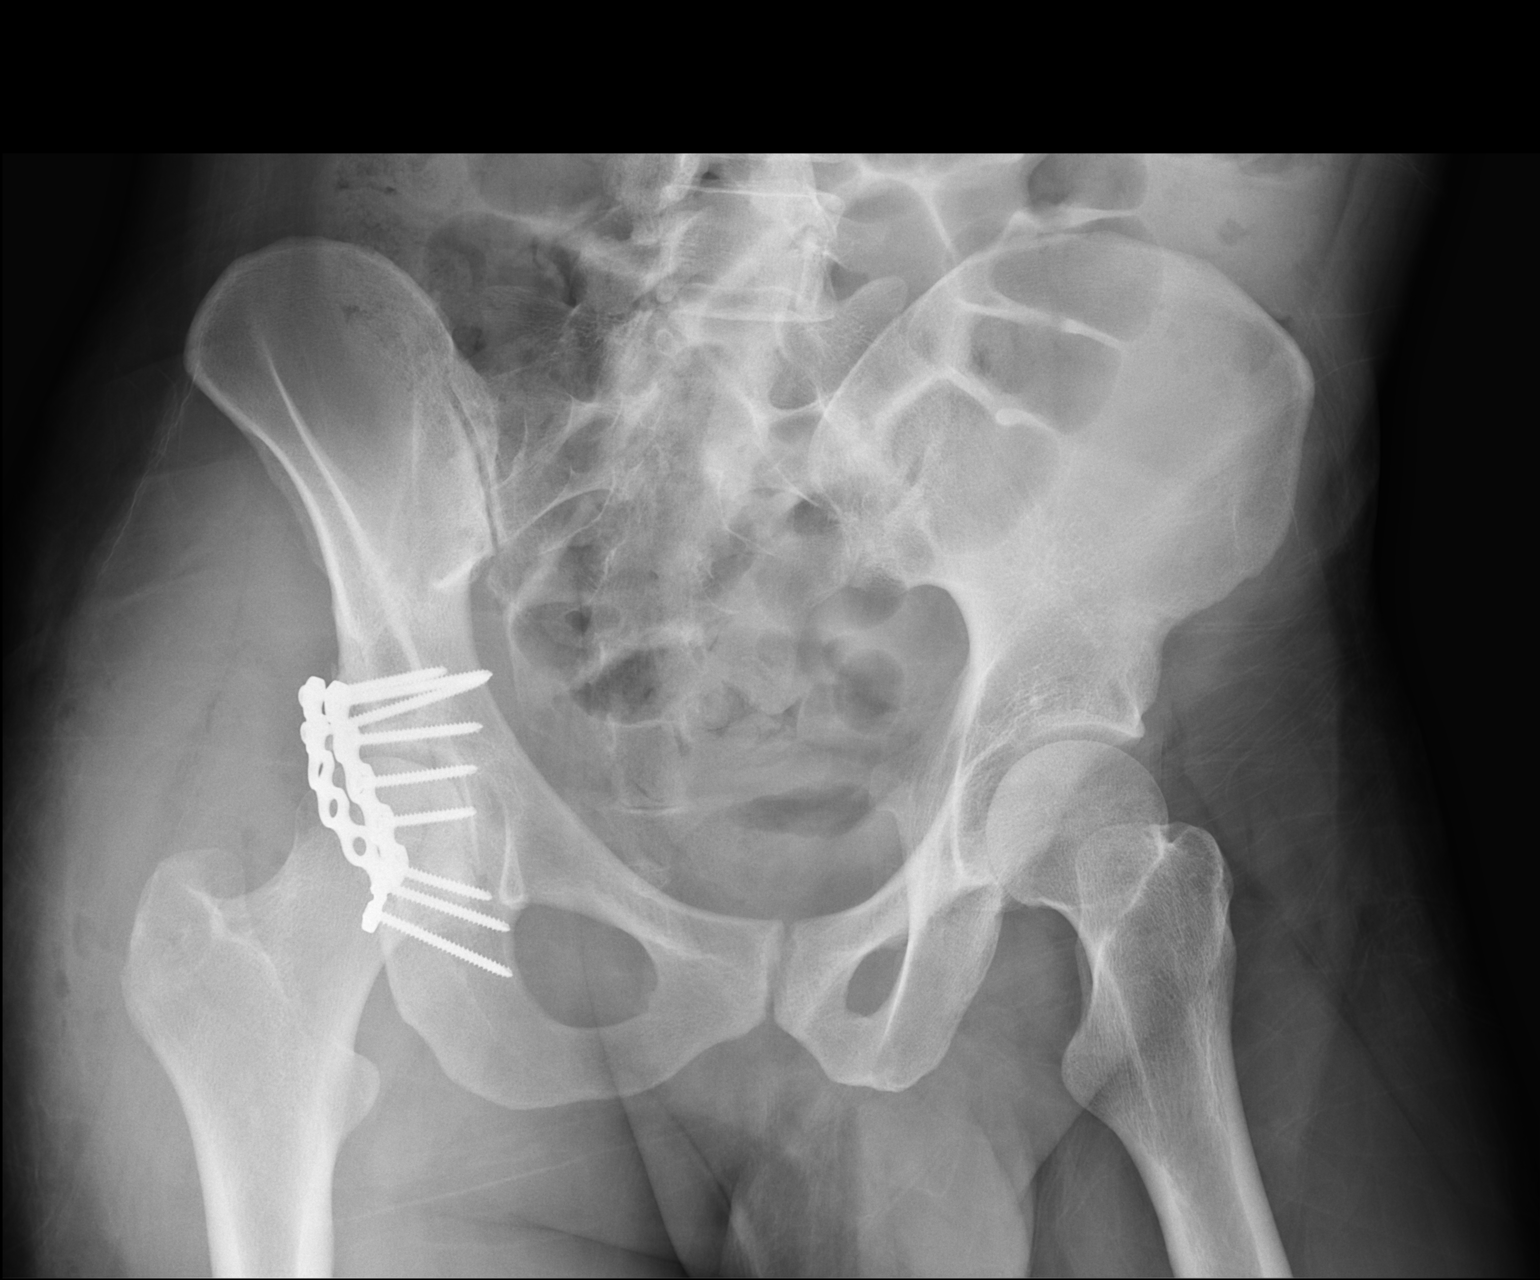

[AP (3 of 3)]
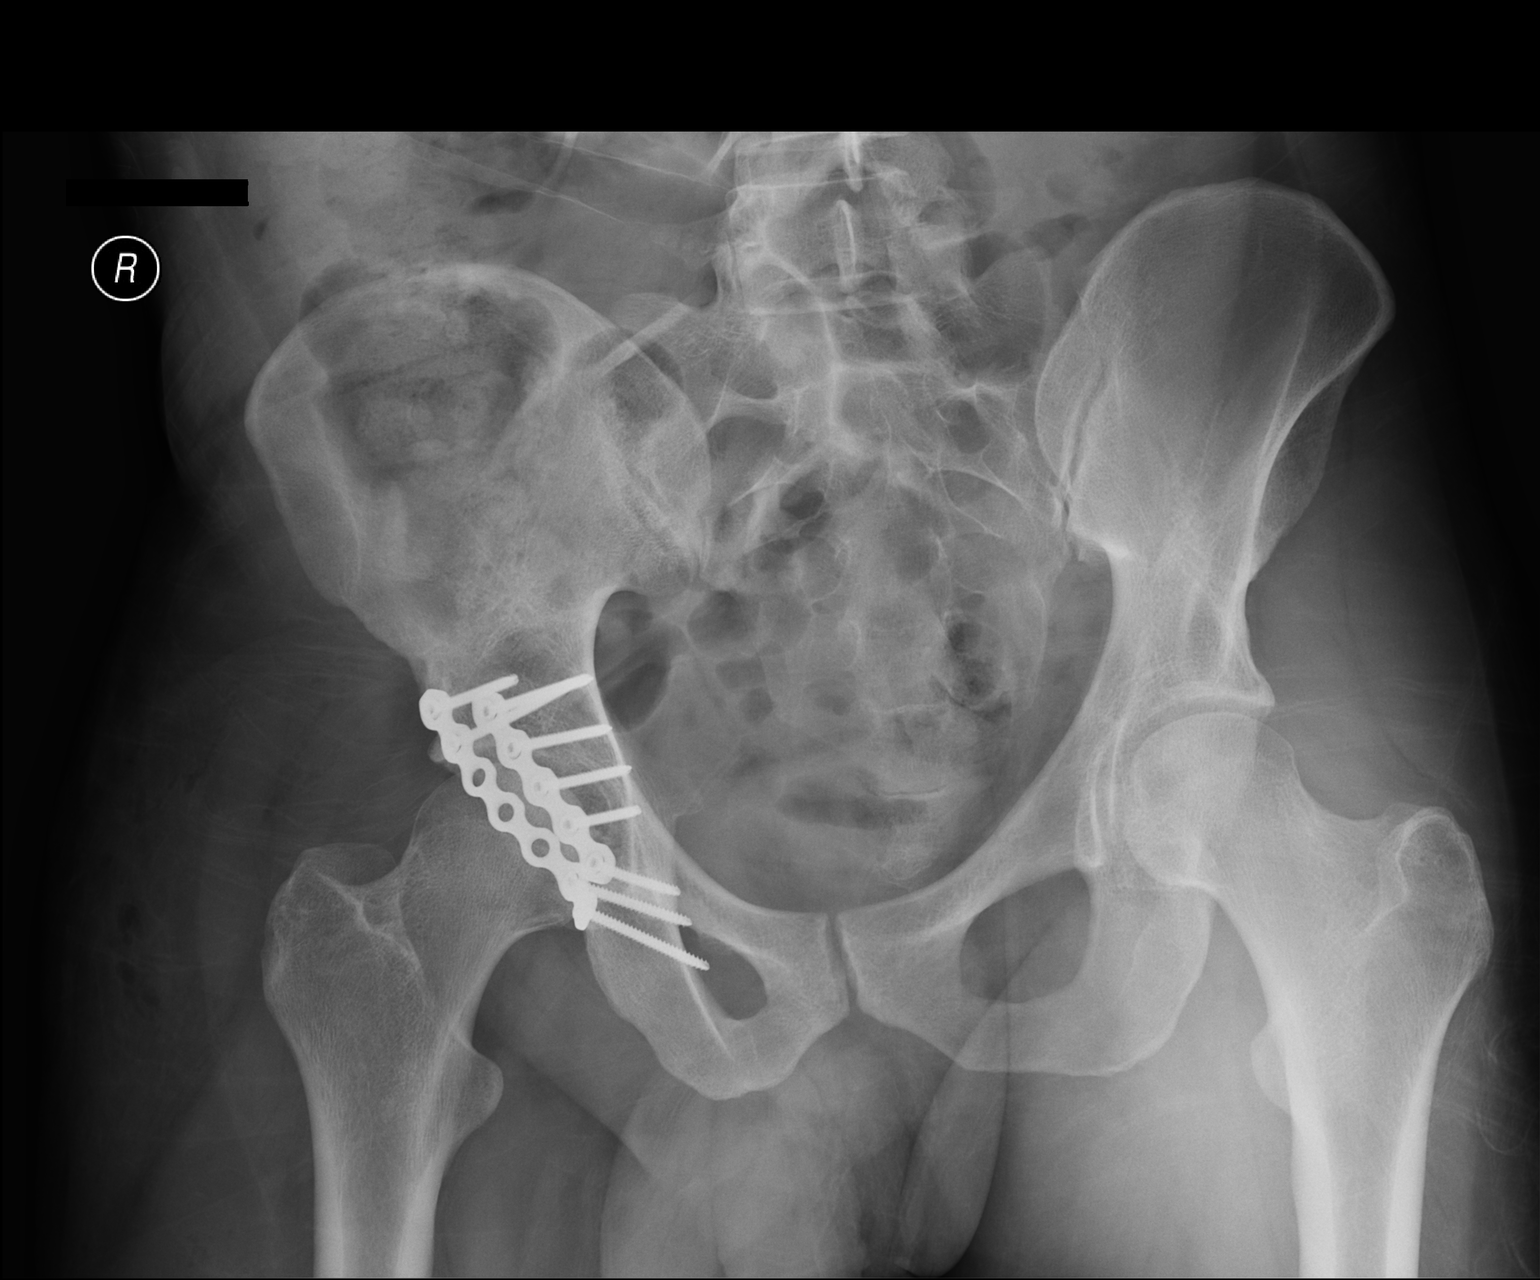

[3 of 3 positions shown; findings below may reference images not displayed]

FINDINGS: Femoral heads are located. Right acetabular internal fixation.
Sacroiliac joints are symmetric. Alignment anatomic.
IMPRESSION: Expected appearance after right acetabular fixation.

## 2023-03-13 ENCOUNTER — Ambulatory Visit: Payer: Self-pay | Admitting: Internal Medicine

## 2023-05-07 ENCOUNTER — Ambulatory Visit: Payer: Self-pay | Admitting: Internal Medicine
